# Patient Record
Sex: Female | Born: 1990 | Race: Black or African American | Hispanic: No | Marital: Married | Smoking: Never smoker
Health system: Southern US, Community
[De-identification: ages and names within clinical notes are randomized; demographics above are authoritative.]

## PROBLEM LIST (undated history)

## (undated) DIAGNOSIS — D649 Anemia, unspecified: Secondary | ICD-10-CM

## (undated) HISTORY — DX: Anemia, unspecified: D64.9

---

## 2017-07-16 ENCOUNTER — Encounter: Payer: Self-pay | Admitting: Obstetrics & Gynecology

## 2017-07-16 ENCOUNTER — Ambulatory Visit (INDEPENDENT_AMBULATORY_CARE_PROVIDER_SITE_OTHER): Payer: BLUE CROSS/BLUE SHIELD | Admitting: Obstetrics & Gynecology

## 2017-07-16 ENCOUNTER — Other Ambulatory Visit: Payer: Self-pay | Admitting: *Deleted

## 2017-07-16 VITALS — BP 114/70 | HR 73 | Ht 66.0 in | Wt 130.0 lb

## 2017-07-16 DIAGNOSIS — Z3481 Encounter for supervision of other normal pregnancy, first trimester: Secondary | ICD-10-CM

## 2017-07-16 DIAGNOSIS — O34219 Maternal care for unspecified type scar from previous cesarean delivery: Secondary | ICD-10-CM | POA: Insufficient documentation

## 2017-07-16 DIAGNOSIS — Z124 Encounter for screening for malignant neoplasm of cervix: Secondary | ICD-10-CM | POA: Diagnosis not present

## 2017-07-16 DIAGNOSIS — Z3480 Encounter for supervision of other normal pregnancy, unspecified trimester: Secondary | ICD-10-CM

## 2017-07-16 DIAGNOSIS — Z3687 Encounter for antenatal screening for uncertain dates: Secondary | ICD-10-CM | POA: Diagnosis not present

## 2017-07-16 DIAGNOSIS — Z113 Encounter for screening for infections with a predominantly sexual mode of transmission: Secondary | ICD-10-CM

## 2017-07-16 DIAGNOSIS — Z3689 Encounter for other specified antenatal screening: Secondary | ICD-10-CM

## 2017-07-16 NOTE — Addendum Note (Signed)
Addended by: Granville LewisLARK, Geselle Cardosa L on: 07/16/2017 03:25 PM   Modules accepted: Orders

## 2017-07-16 NOTE — Progress Notes (Signed)
  Subjective:    Delbert HarnessCherise Steele is being seen today for her first obstetrical visit.  This is not a planned pregnancy. She is at 5 weeks 6 days gestation. Her obstetrical history is significant for [facility]. Relationship with FOB: spouse, living together. Patient does intend to breast feed. She breastfed her other 2 kids. Pregnancy history fully reviewed.  Patient reports no complaints.  Review of Systems:   Review of Systems  Objective:     BP 114/70   Pulse 73   Ht 5\' 6"  (1.676 m)   Wt 130 lb (59 kg)   LMP 03/15/2017   BMI 20.98 kg/m  Physical Exam  Exam  Heart- rrr Lungs- CTAB Abd- benign    Assessment:    Pregnancy: [id number] Patient Active Problem List   Diagnosis Date Noted  . Supervision of other normal pregnancy, antepartum 07/16/2017  . History of cesarean delivery, antepartum 07/16/2017       Plan:     Initial labs drawn. Prenatal vitamins. Problem list reviewed and updated. AFP3 discussed: requested. Role of ultrasound in pregnancy discussed; fetal survey: requested. Amniocentesis discussed: not indicated. Follow up in 6 weeks. Pap done She would like optomized Baby scripts We no longer have flu vaccines in the office.   Allie BossierMyra C Inri Sobieski 07/16/2017

## 2017-07-17 ENCOUNTER — Encounter: Payer: Self-pay | Admitting: Obstetrics & Gynecology

## 2017-07-17 LAB — OBSTETRIC PANEL
Antibody Screen: NOT DETECTED
BASOS PCT: 0.5 %
Basophils Absolute: 22 cells/uL (ref 0–200)
Eosinophils Absolute: 9 cells/uL — ABNORMAL LOW (ref 15–500)
Eosinophils Relative: 0.2 %
HCT: 33.3 % — ABNORMAL LOW (ref 35.0–45.0)
HEMOGLOBIN: 11.2 g/dL — AB (ref 11.7–15.5)
Hepatitis B Surface Ag: NONREACTIVE
Lymphs Abs: 1243 cells/uL (ref 850–3900)
MCH: 28.4 pg (ref 27.0–33.0)
MCHC: 33.6 g/dL (ref 32.0–36.0)
MCV: 84.3 fL (ref 80.0–100.0)
MPV: 11 fL (ref 7.5–12.5)
Monocytes Relative: 8 %
Neutro Abs: 2683 cells/uL (ref 1500–7800)
Neutrophils Relative %: 62.4 %
Platelets: 263 10*3/uL (ref 140–400)
RBC: 3.95 10*6/uL (ref 3.80–5.10)
RDW: 15.9 % — ABNORMAL HIGH (ref 11.0–15.0)
RPR Ser Ql: NONREACTIVE
Rubella: 7.69 index
TOTAL LYMPHOCYTE: 28.9 %
WBC: 4.3 10*3/uL (ref 3.8–10.8)
WBCMIX: 344 {cells}/uL (ref 200–950)

## 2017-07-17 LAB — HIV ANTIBODY (ROUTINE TESTING W REFLEX): HIV 1&2 Ab, 4th Generation: NONREACTIVE

## 2017-07-17 LAB — SICKLE CELL SCREEN: Sickle Solubility Test - HGBRFX: NEGATIVE

## 2017-07-18 LAB — CYTOLOGY - PAP
Bacterial vaginitis: NEGATIVE
CHLAMYDIA, DNA PROBE: NEGATIVE
Candida vaginitis: NEGATIVE
DIAGNOSIS: NEGATIVE
Neisseria Gonorrhea: NEGATIVE

## 2017-07-18 LAB — URINE CULTURE, OB REFLEX

## 2017-07-18 LAB — CULTURE, OB URINE

## 2017-07-23 DIAGNOSIS — Z3480 Encounter for supervision of other normal pregnancy, unspecified trimester: Secondary | ICD-10-CM

## 2017-07-23 DIAGNOSIS — O34219 Maternal care for unspecified type scar from previous cesarean delivery: Secondary | ICD-10-CM

## 2017-08-06 ENCOUNTER — Ambulatory Visit (INDEPENDENT_AMBULATORY_CARE_PROVIDER_SITE_OTHER): Payer: BLUE CROSS/BLUE SHIELD | Admitting: Obstetrics & Gynecology

## 2017-08-06 VITALS — BP 109/76 | HR 87 | Wt 132.0 lb

## 2017-08-06 DIAGNOSIS — O34219 Maternal care for unspecified type scar from previous cesarean delivery: Secondary | ICD-10-CM

## 2017-08-06 DIAGNOSIS — Z3480 Encounter for supervision of other normal pregnancy, unspecified trimester: Secondary | ICD-10-CM

## 2017-08-06 DIAGNOSIS — Z3687 Encounter for antenatal screening for uncertain dates: Secondary | ICD-10-CM

## 2017-08-06 NOTE — Progress Notes (Signed)
   PRENATAL VISIT NOTE  Subjective:  Meagan Steele is a 27 y.o. G3P2002 at [redacted]w[redacted]d being seen today for ongoing prenatal care.  She is currently monitored for the following issues for this low-risk pregnancy and has Supervision of other normal pregnancy, antepartum and History of cesarean delivery, antepartum on their problem list.  Patient reports no complaints.   . Vag. Bleeding: None.   . Denies leaking of fluid.   The following portions of the patient's history were reviewed and updated as appropriate: allergies, current medications, past family history, past medical history, past social history, past surgical history and problem list. Problem list updated.  Objective:   Vitals:   08/06/17 1449  BP: 109/76  Pulse: 87  Weight: 132 lb (59.9 kg)    Fetal Status: Fetal Heart Rate (bpm): 165         General:  Alert, oriented and cooperative. Patient is in no acute distress.  Skin: Skin is warm and dry. No rash noted.   Cardiovascular: Normal heart rate noted  Respiratory: Normal respiratory effort, no problems with respiration noted  Abdomen: Soft, gravid, appropriate for gestational age.  Pain/Pressure: Absent     Pelvic: Cervical exam deferred        Extremities: Normal range of motion.  Edema: None  Mental Status: Normal mood and affect. Normal behavior. Normal judgment and thought content.   Assessment and Plan:  Pregnancy: G3P2002 at [redacted]w[redacted]d  1. Supervision of other normal pregnancy, antepartum   2. History of cesarean delivery, antepartum   Preterm labor symptoms and general obstetric precautions including but not limited to vaginal bleeding, contractions, leaking of fluid and fetal movement were reviewed in detail with the patient. Please refer to After Visit Summary for other counseling recommendations.  Return in about 1 month (around 09/03/2017).  Future Appointments  Date Time Provider Department Center  08/27/2017  2:00 PM Allie Bossier, MD [facility]     Allie Bossier, MD

## 2017-08-06 NOTE — Progress Notes (Signed)
Bedside ultrasound performed today. CRL measures 8-[redacted] weeks gestation. No bleeding visualized. FHR 165 bpm and fetus active. Armandina Stammer RN

## 2017-08-27 ENCOUNTER — Ambulatory Visit (INDEPENDENT_AMBULATORY_CARE_PROVIDER_SITE_OTHER): Payer: BLUE CROSS/BLUE SHIELD | Admitting: Obstetrics & Gynecology

## 2017-08-27 ENCOUNTER — Encounter: Payer: Self-pay | Admitting: Obstetrics & Gynecology

## 2017-08-27 VITALS — Wt 129.0 lb

## 2017-08-27 DIAGNOSIS — Z3481 Encounter for supervision of other normal pregnancy, first trimester: Secondary | ICD-10-CM

## 2017-08-27 DIAGNOSIS — Z3480 Encounter for supervision of other normal pregnancy, unspecified trimester: Secondary | ICD-10-CM

## 2017-08-27 DIAGNOSIS — O34219 Maternal care for unspecified type scar from previous cesarean delivery: Secondary | ICD-10-CM

## 2017-08-27 NOTE — Progress Notes (Signed)
   PRENATAL VISIT NOTE  Subjective:  Meagan Steele is a 27 y.o. G3P2002 at [redacted]w[redacted]d being seen today for ongoing prenatal care.  She is currently monitored for the following issues for this low-risk pregnancy and has Supervision of other normal pregnancy, antepartum and History of cesarean delivery, antepartum on their problem list.  Patient reports no complaints.  Contractions: Not present. Vag. Bleeding: None.  Movement: Absent. Denies leaking of fluid.   The following portions of the patient's history were reviewed and updated as appropriate: allergies, current medications, past family history, past medical history, past social history, past surgical history and problem list. Problem list updated.  Objective:   Vitals:   08/27/17 1425  Weight: 129 lb (58.5 kg)    Fetal Status:     Movement: Absent     General:  Alert, oriented and cooperative. Patient is in no acute distress.  Skin: Skin is warm and dry. No rash noted.   Cardiovascular: Normal heart rate noted  Respiratory: Normal respiratory effort, no problems with respiration noted  Abdomen: Soft, gravid, appropriate for gestational age.  Pain/Pressure: Absent     Pelvic: Cervical exam deferred        Extremities: Normal range of motion.  Edema: None  Mental Status: Normal mood and affect. Normal behavior. Normal judgment and thought content.   Assessment and Plan:  Pregnancy: G3P2002 at [redacted]w[redacted]d  1. Supervision of other normal pregnancy, antepartum   2. History of cesarean delivery, antepartum - would like a TOLAC  Preterm labor symptoms and general obstetric precautions including but not limited to vaginal bleeding, contractions, leaking of fluid and fetal movement were reviewed in detail with the patient. Please refer to After Visit Summary for other counseling recommendations.  No follow-ups on file.  No future appointments.  Allie Bossier, MD

## 2017-10-23 ENCOUNTER — Ambulatory Visit (HOSPITAL_COMMUNITY): Payer: BLUE CROSS/BLUE SHIELD

## 2017-10-29 ENCOUNTER — Encounter (HOSPITAL_COMMUNITY): Payer: Self-pay

## 2017-10-29 ENCOUNTER — Encounter: Payer: BLUE CROSS/BLUE SHIELD | Admitting: Certified Nurse Midwife

## 2017-11-05 ENCOUNTER — Other Ambulatory Visit: Payer: Self-pay | Admitting: Obstetrics & Gynecology

## 2017-11-05 ENCOUNTER — Ambulatory Visit (HOSPITAL_COMMUNITY)
Admission: RE | Admit: 2017-11-05 | Discharge: 2017-11-05 | Disposition: A | Discharge status: Home / Self Care | Payer: BLUE CROSS/BLUE SHIELD | Source: Ambulatory Visit | Attending: Obstetrics & Gynecology | Admitting: Obstetrics & Gynecology

## 2017-11-05 DIAGNOSIS — Z3A21 21 weeks gestation of pregnancy: Secondary | ICD-10-CM | POA: Diagnosis not present

## 2017-11-05 DIAGNOSIS — O34219 Maternal care for unspecified type scar from previous cesarean delivery: Secondary | ICD-10-CM | POA: Diagnosis not present

## 2017-11-05 DIAGNOSIS — Z363 Encounter for antenatal screening for malformations: Secondary | ICD-10-CM | POA: Insufficient documentation

## 2017-11-05 DIAGNOSIS — Z3480 Encounter for supervision of other normal pregnancy, unspecified trimester: Secondary | ICD-10-CM

## 2017-11-07 ENCOUNTER — Encounter: Payer: BLUE CROSS/BLUE SHIELD | Admitting: Obstetrics & Gynecology

## 2017-11-26 ENCOUNTER — Ambulatory Visit (INDEPENDENT_AMBULATORY_CARE_PROVIDER_SITE_OTHER): Payer: BLUE CROSS/BLUE SHIELD | Admitting: Advanced Practice Midwife

## 2017-11-26 VITALS — BP 109/65 | HR 102 | Wt 140.0 lb

## 2017-11-26 DIAGNOSIS — O34219 Maternal care for unspecified type scar from previous cesarean delivery: Secondary | ICD-10-CM | POA: Diagnosis not present

## 2017-11-26 DIAGNOSIS — Z3480 Encounter for supervision of other normal pregnancy, unspecified trimester: Secondary | ICD-10-CM

## 2017-11-26 LAB — POCT URINALYSIS DIPSTICK OB
Glucose, UA: NEGATIVE — AB
PROTEIN: NEGATIVE

## 2017-11-26 NOTE — Progress Notes (Signed)
   PRENATAL VISIT NOTE  Subjective:  Meagan HarnessCherise Steele is a 27 y.o. G3P2002 at 2476w6d being seen today for ongoing prenatal care.  She is currently monitored for the following issues for this low-risk pregnancy and has Supervision of other normal pregnancy, antepartum and History of cesarean delivery, antepartum on their problem list.  Patient reports no complaints.  Contractions: Not present. Vag. Bleeding: None.  Movement: Present. Denies leaking of fluid.   The following portions of the patient's history were reviewed and updated as appropriate: allergies, current medications, past family history, past medical history, past social history, past surgical history and problem list. Problem list updated.  Objective:   Vitals:   11/26/17 0928  BP: 109/65  Pulse: (!) 102  Weight: 63.5 kg    Fetal Status: Fetal Heart Rate (bpm): 150 Fundal Height: 25 cm Movement: Present     VS reviewed, nursing note reviewed,  Constitutional: well developed, well nourished, no distress HEENT: normocephalic CV: normal rate Pulm/chest wall: normal effort Abdomen: soft Neuro: alert and oriented x 3 Skin: warm, dry Psych: affect normal   Assessment and Plan:  Pregnancy: G3P2002 at 2176w6d  1. Supervision of other normal pregnancy, antepartum --Anticipatory guidance about next visits/weeks of pregnancy given. - POC Urinalysis Dipstick OB --Reviewed contraception options today, with LARCs presented as most effective.  2. History of cesarean delivery, antepartum --Desires TOLAC.  Given consent form to take home at today's visit.  Preterm labor symptoms and general obstetric precautions including but not limited to vaginal bleeding, contractions, leaking of fluid and fetal movement were reviewed in detail with the patient. Please refer to After Visit Summary for other counseling recommendations.  No follow-ups on file.  Future Appointments  Date Time Provider Department Center  12/26/2017  9:45 AM  Penne LashLeggett, Fredrich RomansKelly H, MD [facility]    Sharen CounterLisa Leftwich-Kirby, CNM

## 2017-11-26 NOTE — Patient Instructions (Signed)

## 2017-12-26 ENCOUNTER — Encounter: Payer: BLUE CROSS/BLUE SHIELD | Admitting: Obstetrics & Gynecology

## 2018-01-30 ENCOUNTER — Encounter: Payer: Self-pay | Admitting: Advanced Practice Midwife

## 2018-06-04 ENCOUNTER — Encounter (HOSPITAL_COMMUNITY): Payer: Self-pay

## 2020-04-25 IMAGING — US US MFM OB COMP +14 WKS
1 series · 13 of 28 positions shown · non-contrast
Comparison: none

[Series 1: us mfm ob comp +14 wks · 108 acquisitions, 13 frames shown]
[im 4/108]
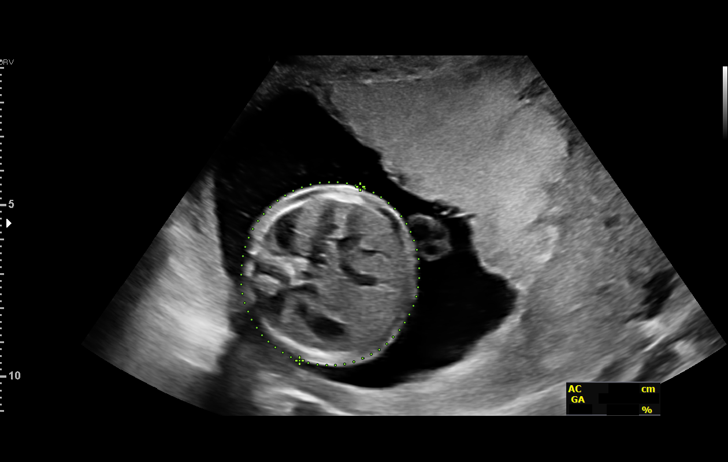
[im 12/108]
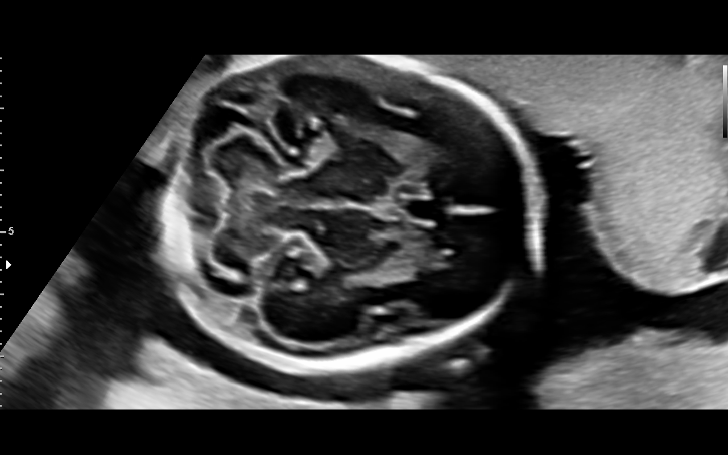
[im 20/108]
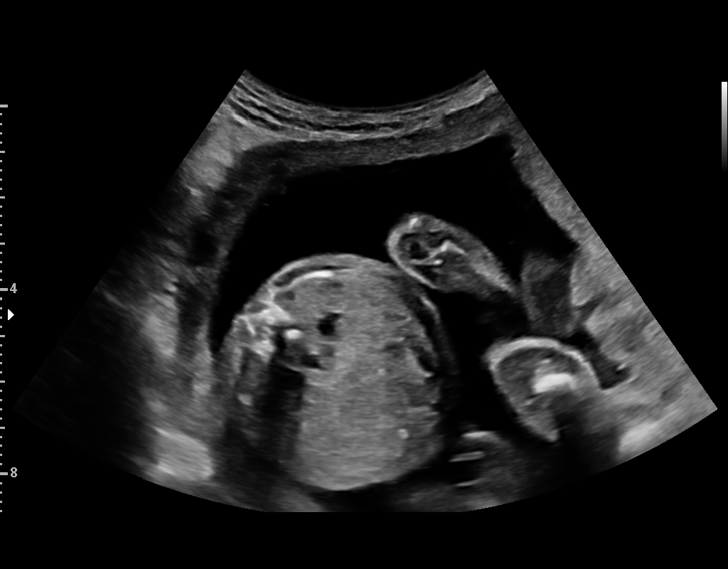
[im 28/108]
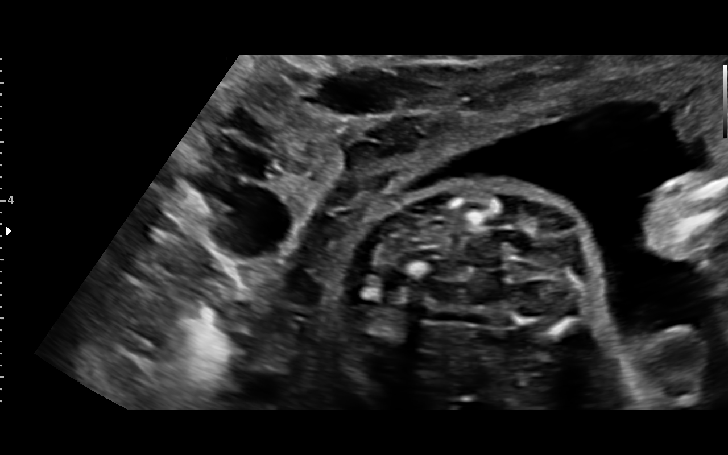
[im 36/108]
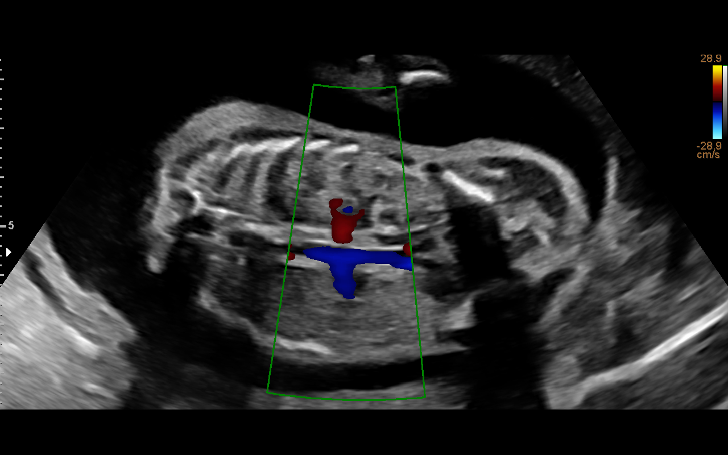
[im 44/108]
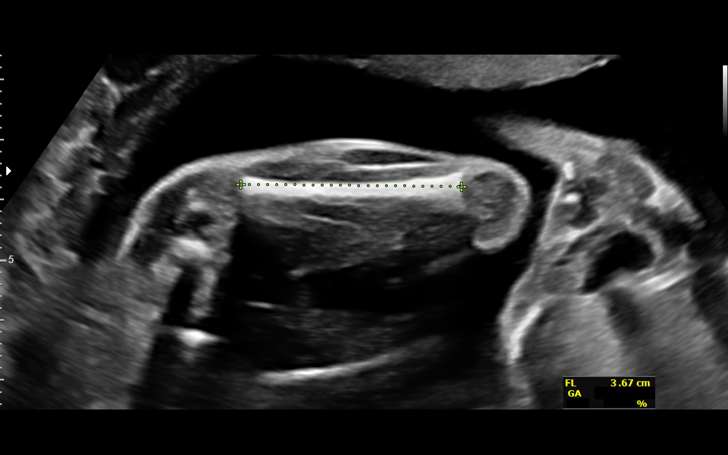
[im 56/108]
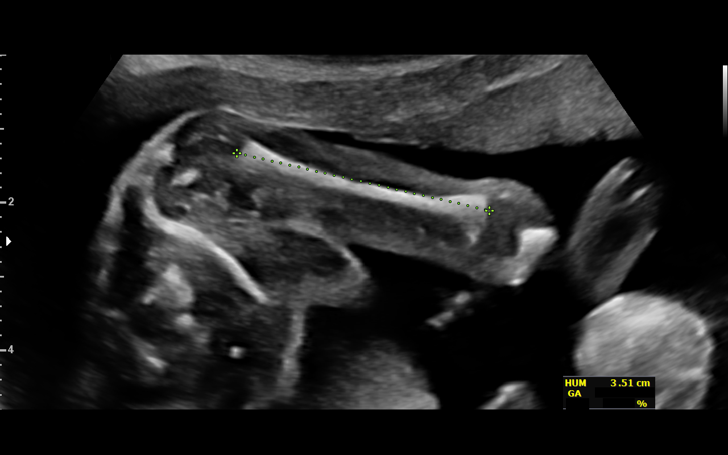
[im 64/108]
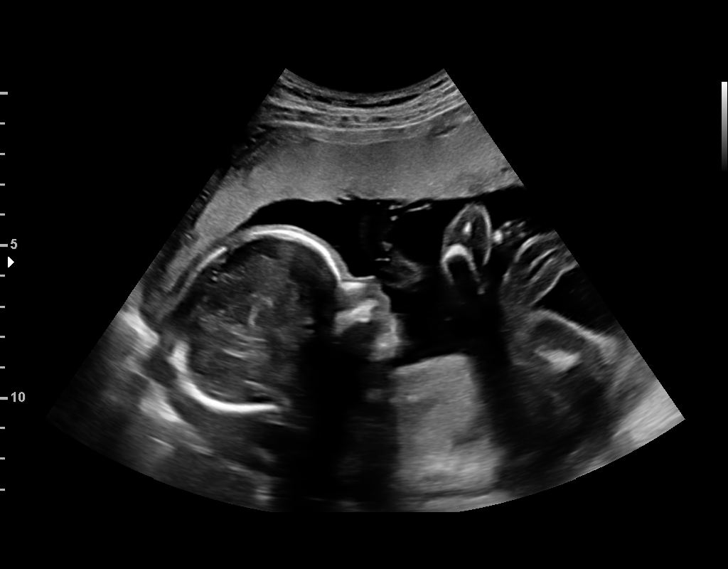
[im 72/108]
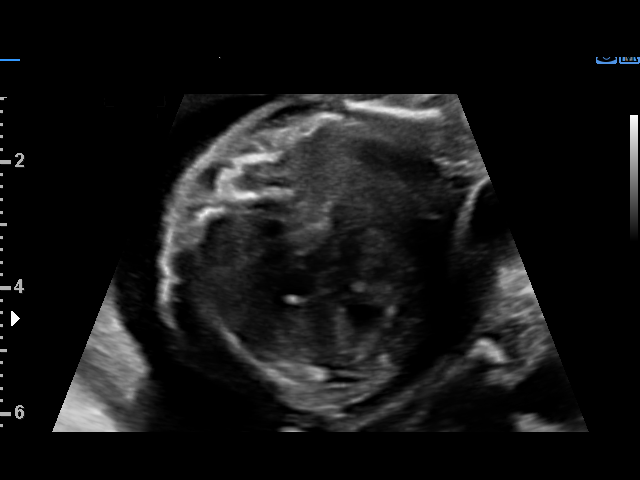
[im 80/108]
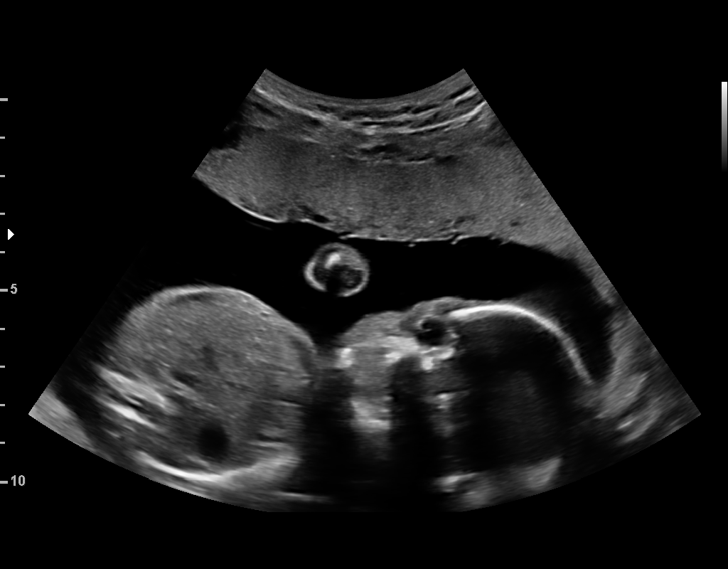
[im 88/108]
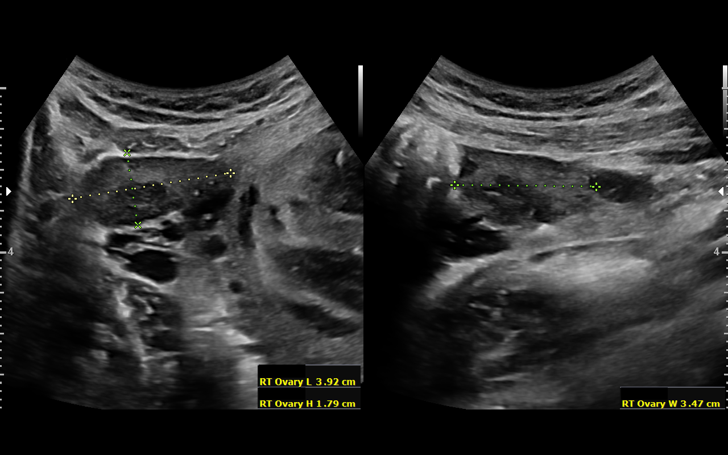
[im 96/108]
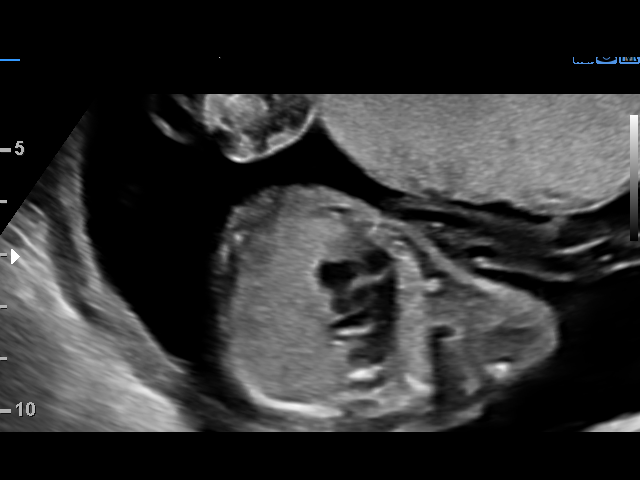
[im 104/108]
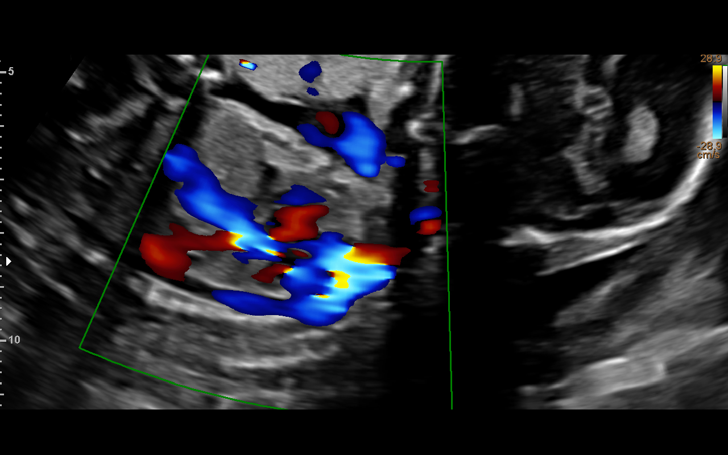

[13 of 28 positions shown; findings below may reference images not displayed]

1  DEANN ZOU                [id number]      [id number]     [id number]
Indications

21 weeks gestation of pregnancy
Encounter for antenatal screening for
malformations
History of cesarean delivery, currently
pregnant
OB History

Blood Type:            Height:  5'6"   Weight (lb):  129       BMI:
Gravidity:    3         Term:   2
Living:       2
Fetal Evaluation

Num Of Fetuses:     1
Fetal Heart         154
Rate(bpm):
Cardiac Activity:   Observed
Presentation:       Breech
Placenta:           Anterior
P. Cord Insertion:  Visualized

Amniotic Fluid
AFI FV:      Subjectively within normal limits

Largest Pocket(cm)
4.2
Biometry

BPD:      51.1  mm     G. Age:  21w 4d         33  %    CI:        76.57   %    70 - 86
FL/HC:      19.9   %    18.4 -
HC:       185   mm     G. Age:  20w 6d          8  %    HC/AC:      1.12        1.06 -
AC:      164.8  mm     G. Age:  21w 4d         32  %    FL/BPD:     72.0   %    71 - 87
FL:       36.8  mm     G. Age:  21w 5d         35  %    FL/AC:      22.3   %    20 - 24
HUM:      34.7  mm     G. Age:  21w 6d         50  %
CER:        25  mm     G. Age:  23w 0d         69  %

CM:        5.1  mm

Est. FW:     431  gm    0 lb 15 oz      36  %
Gestational Age

Clinical EDD:  21w 6d                                        EDD:   03/12/18
U/S Today:     21w 3d                                        EDD:   03/15/18
Best:          21w 6d     Det. By:  Clinical EDD             EDD:   03/12/18
Anatomy

Cranium:               Appears normal         Aortic Arch:            Appears normal
Cavum:                 Appears normal         Ductal Arch:            Appears normal
Ventricles:            Appears normal         Diaphragm:              Appears normal
Choroid Plexus:        Appears normal         Stomach:                Appears normal, left
sided
Cerebellum:            Appears normal         Abdomen:                Appears normal
Posterior Fossa:       Appears normal         Abdominal Wall:         Appears nml (cord
insert, abd wall)
Nuchal Fold:           Not applicable (>20    Cord Vessels:           Appears normal (3
wks GA)                                        vessel cord)
Face:                  Appears normal         Kidneys:                Appear normal
(orbits and profile)
Lips:                  Appears normal         Bladder:                Appears normal
Thoracic:              Appears normal         Spine:                  Appears normal
Heart:                 Appears normal         Upper Extremities:      Appears normal
(4CH, axis, and situs
RVOT:                  Appears normal         Lower Extremities:      Appears normal
LVOT:                  Appears normal

Other:  Fetus appears to be a male. Nasal bone visualized.
Cervix Uterus Adnexa

Cervix
Length:           4.78  cm.
Normal appearance by transabdominal scan.

Uterus
No abnormality visualized.

Left Ovary
Size(cm)       2.7  x   1.8    x  2         Vol(ml):
Within normal limits.

Right Ovary
Size(cm)       3.9  x   1.8    x  3.5       Vol(ml):
Within normal limits.
Impression

We performed fetal anatomy scan. No makers of
aneuploidies or fetal structural defects are seen. Fetal
biometry is consistent with her previously-established dates.
Amniotic fluid is normal and good fetal activity is seen.
Placenta is anterior and there is no evidence of previa or
accreta.
Recommendations

Follow-up scans as clinically indicated.
